# Patient Record
Sex: Male | Born: 2005 | Race: Black or African American | Hispanic: No | Marital: Single
Health system: Southern US, Community
[De-identification: ages and names within clinical notes are randomized; demographics above are authoritative.]

## PROBLEM LIST (undated history)

## (undated) ENCOUNTER — Emergency Department (HOSPITAL_BASED_OUTPATIENT_CLINIC_OR_DEPARTMENT_OTHER): Admission: EM | Payer: Medicaid Other | Source: Home / Self Care

## (undated) DIAGNOSIS — J45909 Unspecified asthma, uncomplicated: Secondary | ICD-10-CM

---
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
## 2024-02-04 ENCOUNTER — Encounter: Payer: Self-pay | Admitting: *Deleted

## 2024-02-04 ENCOUNTER — Other Ambulatory Visit: Payer: Self-pay

## 2024-02-04 ENCOUNTER — Ambulatory Visit
Admission: EM | Admit: 2024-02-04 | Discharge: 2024-02-04 | Disposition: A | Discharge status: Home / Self Care | Payer: MEDICAID | Attending: Emergency Medicine | Admitting: Emergency Medicine

## 2024-02-04 DIAGNOSIS — J4521 Mild intermittent asthma with (acute) exacerbation: Secondary | ICD-10-CM

## 2024-02-04 DIAGNOSIS — J069 Acute upper respiratory infection, unspecified: Secondary | ICD-10-CM

## 2024-02-04 LAB — POC COVID19/FLU A&B COMBO
Covid Antigen, POC: NEGATIVE
Influenza A Antigen, POC: NEGATIVE
Influenza B Antigen, POC: NEGATIVE

## 2024-02-04 MED ORDER — ALBUTEROL SULFATE HFA 108 (90 BASE) MCG/ACT IN AERS: 2.0000 | INHALATION_SPRAY | Freq: Four times a day (QID) | RESPIRATORY_TRACT | 1 refills | Status: AC | PRN

## 2024-02-04 MED ORDER — PROMETHAZINE-DM 6.25-15 MG/5ML PO SYRP: 5.0000 mL | ORAL_SOLUTION | Freq: Four times a day (QID) | ORAL | 0 refills | Status: AC | PRN

## 2024-02-04 MED ORDER — PREDNISONE 20 MG PO TABS: 40.0000 mg | ORAL_TABLET | Freq: Every day | ORAL | 0 refills | Status: AC

## 2024-02-04 NOTE — Discharge Instructions (Signed)
 Please use the inhaler 3 times daily (every 6 hours) for the next several days. Then continue as needed.  Starting tomorrow (Monday) -- Prednisone  2 tablets daily for 5 days  The promethazine  DM cough syrup can be used up to 4 times daily. If this medication makes you drowsy, take only once before bed.  Drink lots of fluids!
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
## 2024-02-04 NOTE — ED Triage Notes (Signed)
 Cough (productive for mucous), congestion, scratchy throat, subjective fever, chest hurts with cough since Friday. He has taken amoxicillin  (left over), mucinex, nyquil, and dayquil. His friend has the same symptoms.

## 2024-03-18 ENCOUNTER — Encounter: Payer: Self-pay | Admitting: Radiology
